# Patient Record
Sex: Female | Born: 1955
Health system: Southern US, Community
[De-identification: ages and names within clinical notes are randomized; demographics above are authoritative.]

---

## 2015-10-17 ENCOUNTER — Other Ambulatory Visit: Payer: Self-pay | Admitting: Family Medicine

## 2015-10-17 DIAGNOSIS — Z1231 Encounter for screening mammogram for malignant neoplasm of breast: Secondary | ICD-10-CM

## 2015-10-27 ENCOUNTER — Ambulatory Visit
Admission: RE | Admit: 2015-10-27 | Discharge: 2015-10-27 | Disposition: A | Discharge status: Home / Self Care | Payer: BC Managed Care – PPO | Source: Ambulatory Visit | Attending: Family Medicine | Admitting: Family Medicine

## 2015-10-27 DIAGNOSIS — Z1231 Encounter for screening mammogram for malignant neoplasm of breast: Secondary | ICD-10-CM | POA: Diagnosis not present

## 2017-10-28 ENCOUNTER — Other Ambulatory Visit: Payer: Self-pay | Admitting: Pediatrics

## 2017-10-28 DIAGNOSIS — Z1231 Encounter for screening mammogram for malignant neoplasm of breast: Secondary | ICD-10-CM

## 2019-10-08 ENCOUNTER — Other Ambulatory Visit: Payer: Self-pay | Admitting: Pediatrics

## 2019-10-22 ENCOUNTER — Other Ambulatory Visit: Payer: Self-pay | Admitting: Pediatrics

## 2019-11-24 ENCOUNTER — Other Ambulatory Visit: Payer: Self-pay | Admitting: Pediatrics

## 2019-11-24 DIAGNOSIS — Z1231 Encounter for screening mammogram for malignant neoplasm of breast: Secondary | ICD-10-CM

## 2019-12-03 ENCOUNTER — Ambulatory Visit: Payer: BC Managed Care – PPO

## 2019-12-23 ENCOUNTER — Encounter (INDEPENDENT_AMBULATORY_CARE_PROVIDER_SITE_OTHER): Payer: Self-pay

## 2019-12-23 ENCOUNTER — Ambulatory Visit
Admission: RE | Admit: 2019-12-23 | Discharge: 2019-12-23 | Disposition: A | Discharge status: Home / Self Care | Payer: BC Managed Care – PPO | Source: Ambulatory Visit | Attending: Pediatrics | Admitting: Pediatrics

## 2019-12-23 ENCOUNTER — Other Ambulatory Visit: Payer: Self-pay

## 2019-12-23 DIAGNOSIS — Z1231 Encounter for screening mammogram for malignant neoplasm of breast: Secondary | ICD-10-CM | POA: Insufficient documentation

## 2019-12-28 ENCOUNTER — Other Ambulatory Visit: Payer: Self-pay | Admitting: Pediatrics

## 2019-12-28 DIAGNOSIS — R928 Other abnormal and inconclusive findings on diagnostic imaging of breast: Secondary | ICD-10-CM

## 2019-12-28 DIAGNOSIS — N6320 Unspecified lump in the left breast, unspecified quadrant: Secondary | ICD-10-CM

## 2019-12-28 DIAGNOSIS — R921 Mammographic calcification found on diagnostic imaging of breast: Secondary | ICD-10-CM

## 2019-12-28 DIAGNOSIS — N6489 Other specified disorders of breast: Secondary | ICD-10-CM

## 2020-01-05 ENCOUNTER — Ambulatory Visit
Admission: RE | Admit: 2020-01-05 | Discharge: 2020-01-05 | Disposition: A | Discharge status: Home / Self Care | Payer: BC Managed Care – PPO | Source: Ambulatory Visit | Attending: Pediatrics | Admitting: Pediatrics

## 2020-01-05 ENCOUNTER — Other Ambulatory Visit: Payer: Self-pay

## 2020-01-05 DIAGNOSIS — R928 Other abnormal and inconclusive findings on diagnostic imaging of breast: Secondary | ICD-10-CM | POA: Diagnosis not present

## 2020-01-05 DIAGNOSIS — N6320 Unspecified lump in the left breast, unspecified quadrant: Secondary | ICD-10-CM | POA: Insufficient documentation

## 2020-01-05 DIAGNOSIS — N6489 Other specified disorders of breast: Secondary | ICD-10-CM | POA: Insufficient documentation

## 2020-01-05 DIAGNOSIS — R921 Mammographic calcification found on diagnostic imaging of breast: Secondary | ICD-10-CM | POA: Diagnosis present

## 2020-01-06 ENCOUNTER — Other Ambulatory Visit: Payer: Self-pay | Admitting: Pediatrics

## 2020-01-06 DIAGNOSIS — R921 Mammographic calcification found on diagnostic imaging of breast: Secondary | ICD-10-CM

## 2020-01-06 DIAGNOSIS — R928 Other abnormal and inconclusive findings on diagnostic imaging of breast: Secondary | ICD-10-CM

## 2020-08-26 ENCOUNTER — Ambulatory Visit
Admission: RE | Admit: 2020-08-26 | Discharge: 2020-08-26 | Disposition: A | Discharge status: Home / Self Care | Payer: BC Managed Care – PPO | Source: Ambulatory Visit | Attending: Pediatrics | Admitting: Pediatrics

## 2020-08-26 ENCOUNTER — Other Ambulatory Visit: Payer: Self-pay

## 2020-08-26 DIAGNOSIS — R921 Mammographic calcification found on diagnostic imaging of breast: Secondary | ICD-10-CM | POA: Insufficient documentation

## 2020-08-26 DIAGNOSIS — R928 Other abnormal and inconclusive findings on diagnostic imaging of breast: Secondary | ICD-10-CM | POA: Insufficient documentation

## 2020-08-29 ENCOUNTER — Other Ambulatory Visit: Payer: Self-pay | Admitting: Pediatrics

## 2020-08-29 DIAGNOSIS — R928 Other abnormal and inconclusive findings on diagnostic imaging of breast: Secondary | ICD-10-CM

## 2020-08-29 DIAGNOSIS — R921 Mammographic calcification found on diagnostic imaging of breast: Secondary | ICD-10-CM

## 2020-09-08 ENCOUNTER — Other Ambulatory Visit: Payer: Self-pay

## 2020-09-08 ENCOUNTER — Ambulatory Visit
Admission: RE | Admit: 2020-09-08 | Discharge: 2020-09-08 | Disposition: A | Discharge status: Home / Self Care | Payer: BC Managed Care – PPO | Source: Ambulatory Visit | Attending: Pediatrics | Admitting: Pediatrics

## 2020-09-08 DIAGNOSIS — R928 Other abnormal and inconclusive findings on diagnostic imaging of breast: Secondary | ICD-10-CM | POA: Diagnosis present

## 2020-09-08 DIAGNOSIS — R921 Mammographic calcification found on diagnostic imaging of breast: Secondary | ICD-10-CM | POA: Diagnosis present

## 2020-09-08 HISTORY — PX: BREAST BIOPSY: SHX20

## 2020-09-13 ENCOUNTER — Encounter: Payer: Self-pay | Admitting: *Deleted

## 2020-09-13 DIAGNOSIS — C50912 Malignant neoplasm of unspecified site of left female breast: Secondary | ICD-10-CM

## 2020-09-13 NOTE — Progress Notes (Signed)
Received message from Electa Sniff, RN from [facility] that patient had been informed of her new diagnosis of invasive mammary carcinoma and that she was ready for navigation to call.  I have let a message on her home and cell phone to return my call.

## 2020-09-14 ENCOUNTER — Encounter: Payer: Self-pay | Admitting: *Deleted

## 2020-09-14 NOTE — Progress Notes (Signed)
Called patient and left messages on her home and cell phone numbers.  I would like to assist with coordinating her care for med/onc and surgical consult.  I did see in Epic that patient saw her PCP and trying to decide where to go for her care.  Notes state that her PCP will make the referral.

## 2020-09-19 LAB — SURGICAL PATHOLOGY

## 2020-09-20 ENCOUNTER — Inpatient Hospital Stay: Payer: BC Managed Care – PPO

## 2020-09-20 ENCOUNTER — Inpatient Hospital Stay: Payer: BC Managed Care – PPO | Admitting: Oncology

## 2020-09-21 ENCOUNTER — Encounter: Payer: Self-pay | Admitting: *Deleted

## 2023-05-07 IMAGING — MG MM BREAST BX W LOC DEV 1ST LESION IMAGE BX SPEC STEREO GUIDE*L*
7 of 13 series · 7 of 29 positions shown · non-contrast
Comparison: Previous exams.
COMPARISON: Previous exams.

Addendum:
CLINICAL DATA: Since for stereotactic core needle biopsy of a
cm group of internal microcalcifications over the posterior third of
the inner midportion of the left breast.

EXAM:
LEFT BREAST STEREOTACTIC CORE NEEDLE BIOPSY

[L (1 of 2)]
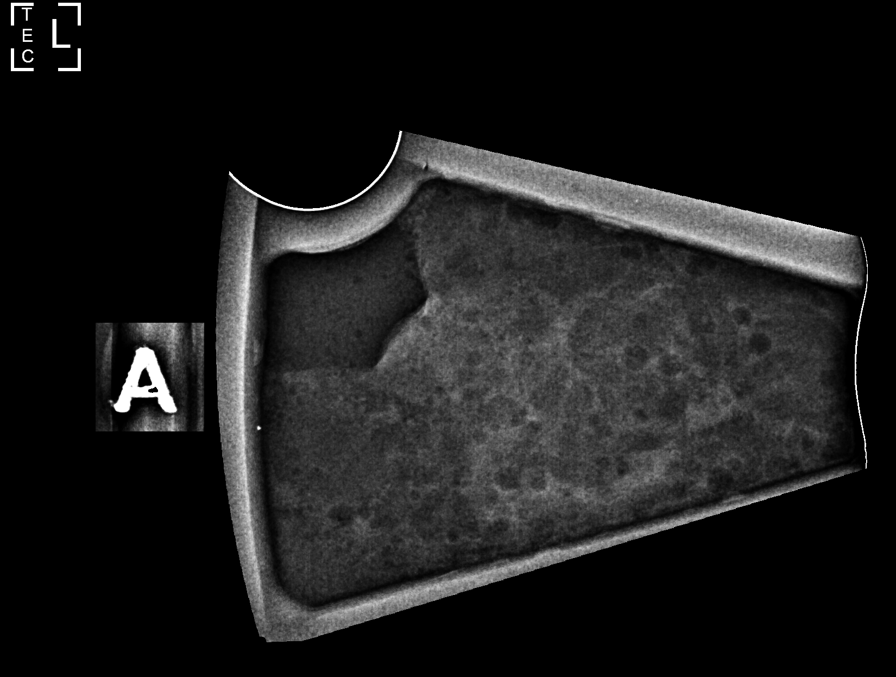

[L (2 of 2)]
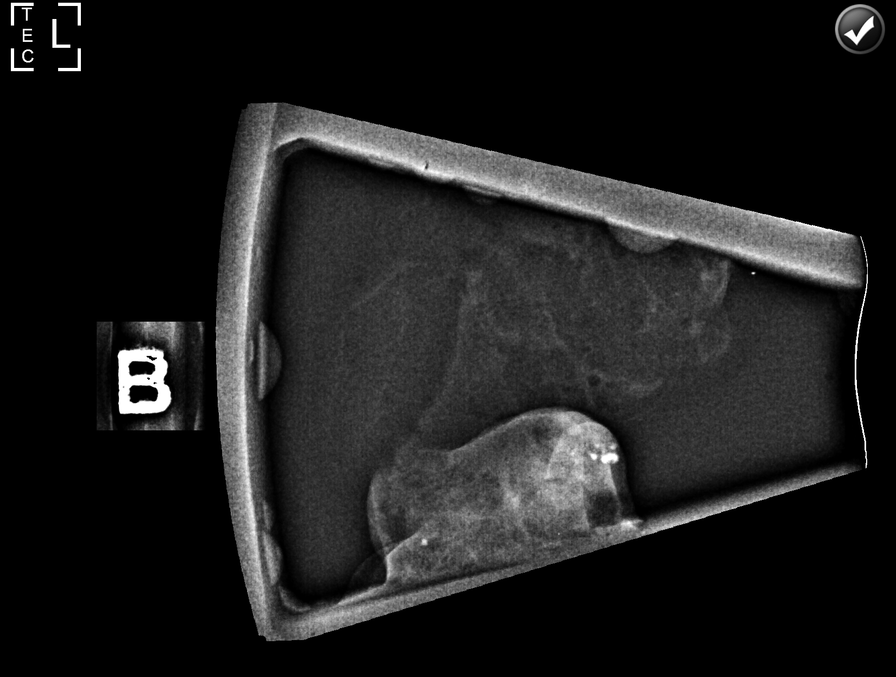

[L ML (1 of 5)]
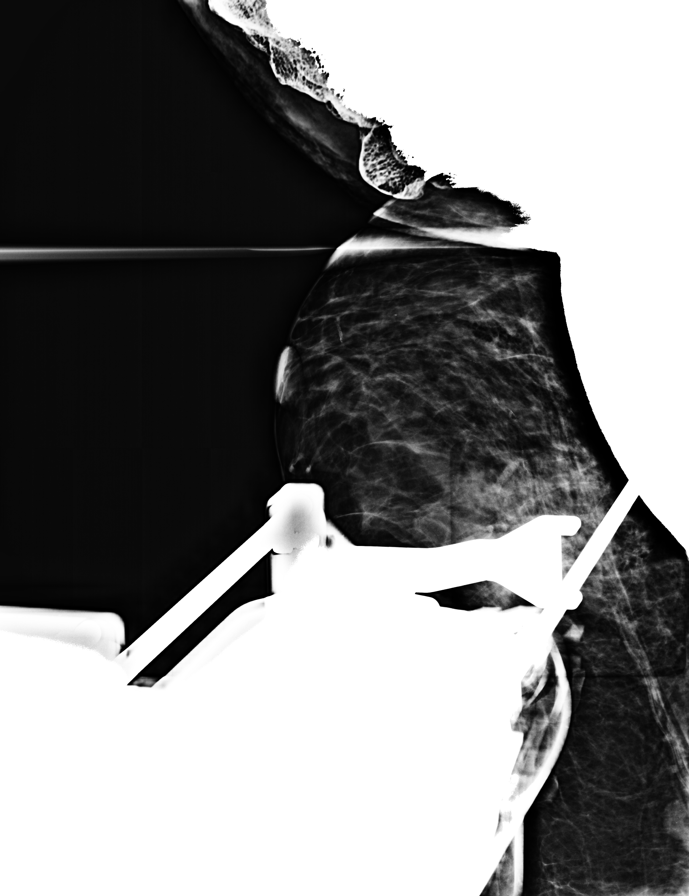

[L ML (2 of 5)]
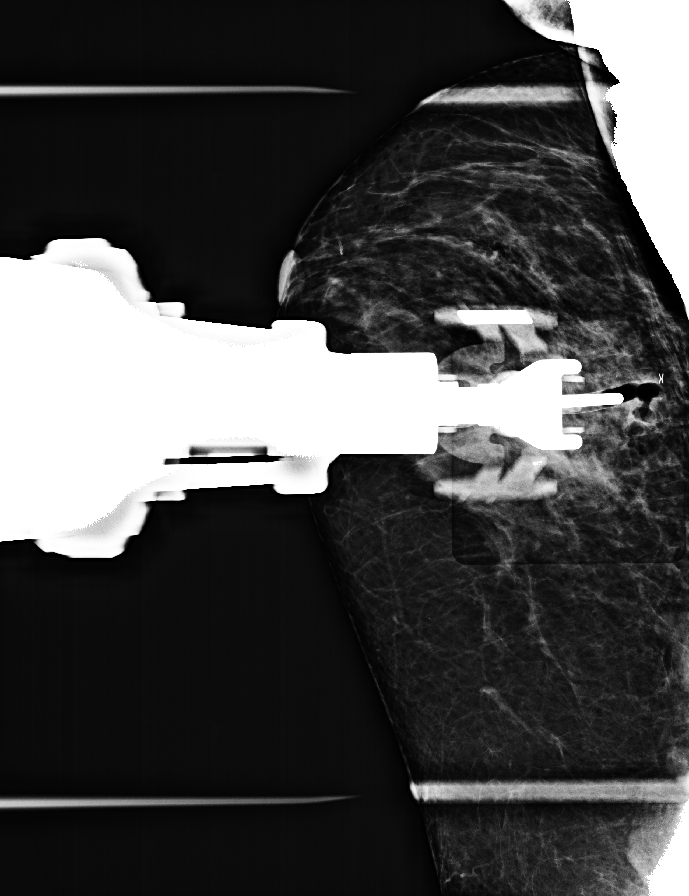

[L ML (3 of 5)]
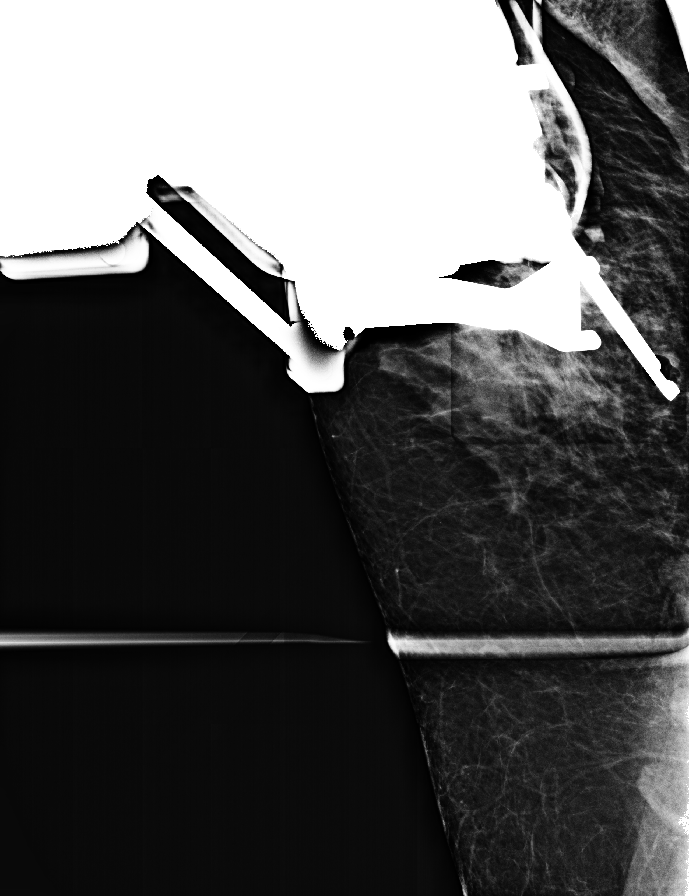

[L ML (4 of 5)]
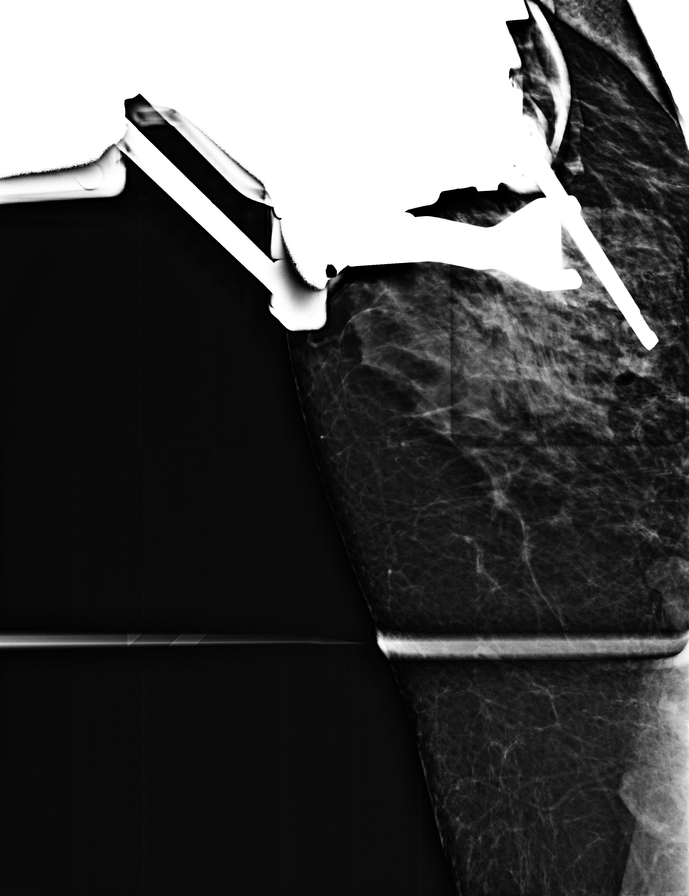

[L ML (5 of 5)]
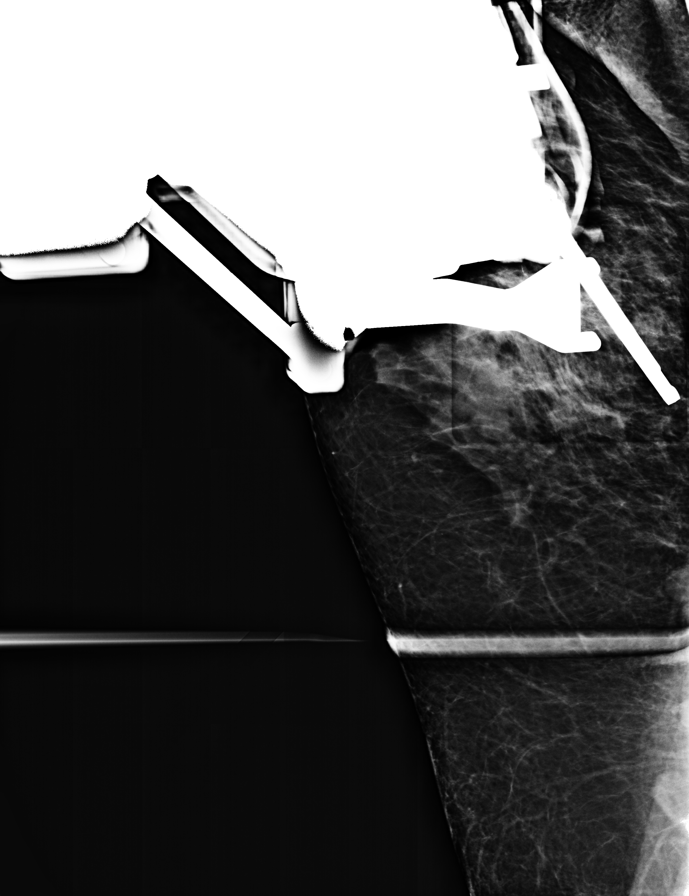

[7 of 29 positions shown; findings below may reference images not displayed]



Using sterile technique and 1% Lidocaine as local anesthetic, under
stereotactic guidance, a 9 gauge vacuum assisted device was used to
perform core needle biopsy of the targeted microcalcifications over
the posterior third of the inner midportion of the left breast using
a medial to lateral approach. Specimen radiograph was performed
showing several of the targeted microcalcifications. Specimens with
calcifications are identified for pathology. Patient experienced a
vasovagal reaction after receiving the anesthetic and prior to
obtaining biopsy specimens. Patient recovered quickly and never lost
consciousness during the mild vasovagal reaction. Due to significant
motion prior to the [DATE] of biopsy specimens, patient had to be
repositioned with Re-targeting and [DATE] of specimens obtained.

Lesion quadrant: Left inner upper quadrant (9-9:30 position).

At the conclusion of the procedure, X shaped tissue marker clip was
deployed into the biopsy cavity. Follow-up 2-view mammogram was
performed and dictated separately.
IMPRESSION: Stereotactic-guided biopsy of indeterminate microcalcifications left
breast. No apparent complications.

ADDENDUM:
PATHOLOGY revealed: A. LEFT BREAST; STEREOTACTIC BIOPSY: - INVASIVE
MAMMARY CARCINOMA, NO SPECIAL TYPE. Size of invasive carcinoma: 5 mm
in this sample. Histologic grade of invasive carcinoma: Grade 3.
Ductal carcinoma in situ: Present, high-grade with comedonecrosis
and calcifications. Lymphovascular invasion: Present.

Pathology results are CONCORDANT with imaging findings, per Dr.
Frantz Vallejos.

Pathology results and recommendations were discussed with patient
via telephone on 09/09/2020. Patient reported doing well after the
biopsy with no adverse symptoms, and only slight tenderness at the
site. Post biopsy care instructions were reviewed, questions were
answered and my direct phone number was provided. Patient was
encouraged to call [HOSPITAL] for any additional
questions or concerns related to biopsy site.

Recommendations:

1. Surgical consultation: Request for surgical consultation relayed
to Yonathan Vincent RN and Gisli Jerdal RN at [HOSPITAL] [REDACTED] by Deeqa Rayaan Adlaho RN on 09/09/2020.

2. Consider bilateral MRI due to high grade DCIS and to evaluate
extent of breast disease.

Pathology results reported by Deeqa Rayaan Adlaho RN on 09/09/2020.



Using sterile technique and 1% Lidocaine as local anesthetic, under
stereotactic guidance, a 9 gauge vacuum assisted device was used to
perform core needle biopsy of the targeted microcalcifications over
the posterior third of the inner midportion of the left breast using
a medial to lateral approach. Specimen radiograph was performed
showing several of the targeted microcalcifications. Specimens with
calcifications are identified for pathology. Patient experienced a
vasovagal reaction after receiving the anesthetic and prior to
obtaining biopsy specimens. Patient recovered quickly and never lost
consciousness during the mild vasovagal reaction. Due to significant
motion prior to the [DATE] of biopsy specimens, patient had to be
repositioned with Re-targeting and [DATE] of specimens obtained.

Lesion quadrant: Left inner upper quadrant (9-9:30 position).

At the conclusion of the procedure, X shaped tissue marker clip was
deployed into the biopsy cavity. Follow-up 2-view mammogram was
performed and dictated separately.
IMPRESSION: Stereotactic-guided biopsy of indeterminate microcalcifications left
breast. No apparent complications.
